# Patient Record
Sex: Male | Born: 2000 | Race: Black or African American | Hispanic: No | Marital: Single | Smoking: Never smoker
Health system: Southern US, Community
[De-identification: ages and names within clinical notes are randomized; demographics above are authoritative.]

## PROBLEM LIST (undated history)

## (undated) DIAGNOSIS — Z9229 Personal history of other drug therapy: Secondary | ICD-10-CM

## (undated) DIAGNOSIS — Y249XXA Unspecified firearm discharge, undetermined intent, initial encounter: Secondary | ICD-10-CM

## (undated) DIAGNOSIS — Z87898 Personal history of other specified conditions: Secondary | ICD-10-CM

## (undated) DIAGNOSIS — W3400XA Accidental discharge from unspecified firearms or gun, initial encounter: Secondary | ICD-10-CM

## (undated) DIAGNOSIS — Z862 Personal history of diseases of the blood and blood-forming organs and certain disorders involving the immune mechanism: Secondary | ICD-10-CM

## (undated) DIAGNOSIS — J45909 Unspecified asthma, uncomplicated: Secondary | ICD-10-CM

## (undated) DIAGNOSIS — R519 Headache, unspecified: Secondary | ICD-10-CM

## (undated) DIAGNOSIS — I10 Essential (primary) hypertension: Secondary | ICD-10-CM

## (undated) HISTORY — PX: NO PAST SURGERIES: SHX2092

---
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
## 2021-07-14 ENCOUNTER — Encounter (HOSPITAL_BASED_OUTPATIENT_CLINIC_OR_DEPARTMENT_OTHER): Payer: Self-pay | Admitting: *Deleted

## 2021-07-14 ENCOUNTER — Emergency Department (HOSPITAL_BASED_OUTPATIENT_CLINIC_OR_DEPARTMENT_OTHER)
Admission: EM | Admit: 2021-07-14 | Discharge: 2021-07-14 | Disposition: A | Discharge status: Home / Self Care | Payer: Medicaid Other | Attending: Emergency Medicine | Admitting: Emergency Medicine

## 2021-07-14 ENCOUNTER — Other Ambulatory Visit: Payer: Self-pay

## 2021-07-14 DIAGNOSIS — Z5321 Procedure and treatment not carried out due to patient leaving prior to being seen by health care provider: Secondary | ICD-10-CM | POA: Insufficient documentation

## 2021-07-14 DIAGNOSIS — M542 Cervicalgia: Secondary | ICD-10-CM | POA: Insufficient documentation

## 2021-07-14 DIAGNOSIS — Y92410 Unspecified street and highway as the place of occurrence of the external cause: Secondary | ICD-10-CM | POA: Insufficient documentation

## 2021-07-14 HISTORY — DX: Accidental discharge from unspecified firearms or gun, initial encounter: W34.00XA

## 2021-07-14 HISTORY — DX: Unspecified firearm discharge, undetermined intent, initial encounter: Y24.9XXA
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
## 2024-08-05 ENCOUNTER — Ambulatory Visit (HOSPITAL_COMMUNITY)
Admission: EM | Admit: 2024-08-05 | Discharge: 2024-08-05 | Disposition: A | Discharge status: Home / Self Care | Payer: Self-pay | Attending: Emergency Medicine | Admitting: Emergency Medicine

## 2024-08-05 ENCOUNTER — Encounter (HOSPITAL_COMMUNITY): Payer: Self-pay

## 2024-08-05 DIAGNOSIS — A084 Viral intestinal infection, unspecified: Secondary | ICD-10-CM

## 2024-08-05 MED ORDER — ONDANSETRON 4 MG PO TBDP
4.0000 mg | ORAL_TABLET | Freq: Once | ORAL | Status: AC
  Administered 2024-08-05: 4 mg via ORAL

## 2024-08-05 MED ORDER — ONDANSETRON 4 MG PO TBDP
ORAL_TABLET | ORAL | Status: AC
  Filled 2024-08-05: qty 1

## 2024-08-05 MED ORDER — ONDANSETRON 4 MG PO TBDP: 4.0000 mg | ORAL_TABLET | Freq: Three times a day (TID) | ORAL | 0 refills | Status: AC | PRN

## 2024-08-05 NOTE — ED Triage Notes (Signed)
 Abd pain, nausea, emesis, since last night; pt states his son has been passing around a stomach bug
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
## 2024-08-05 NOTE — Discharge Instructions (Addendum)
 Ensure you are drinking at least 64 ounces of water  daily.  I suggest rehydration with an electrolyte solution such as Pedialyte or Gatorade.  You can also do Jell-O and sips of broth throughout the rest of the day.  Starting tomorrow you can follow a bland diet such as bananas, rice, toast and applesauce.  Avoid fried and spicy foods that may further upset the stomach.  Use the Zofran  every 8 hours as needed for nausea and vomiting  Symptoms should improve over the weekend.  If no improvement or any changes seek follow-up care
